# Patient Record
Sex: Female | Born: 1990 | Race: White | Hispanic: No | Marital: Single | Smoking: Former smoker
Health system: Southern US, Community
[De-identification: ages and names within clinical notes are randomized; demographics above are authoritative.]

## PROBLEM LIST (undated history)

## (undated) DIAGNOSIS — T7840XA Allergy, unspecified, initial encounter: Secondary | ICD-10-CM

## (undated) HISTORY — DX: Allergy, unspecified, initial encounter: T78.40XA

---
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
## 2014-08-07 ENCOUNTER — Emergency Department (HOSPITAL_COMMUNITY)
Admission: EM | Admit: 2014-08-07 | Discharge: 2014-08-07 | Discharge status: Absent without leave | Payer: Self-pay | Source: Home / Self Care

## 2014-08-07 ENCOUNTER — Ambulatory Visit (INDEPENDENT_AMBULATORY_CARE_PROVIDER_SITE_OTHER): Payer: Commercial Managed Care - PPO

## 2014-08-07 ENCOUNTER — Ambulatory Visit (INDEPENDENT_AMBULATORY_CARE_PROVIDER_SITE_OTHER): Payer: Commercial Managed Care - PPO | Admitting: Family Medicine

## 2014-08-07 DIAGNOSIS — S20212A Contusion of left front wall of thorax, initial encounter: Secondary | ICD-10-CM

## 2014-08-07 DIAGNOSIS — R21 Rash and other nonspecific skin eruption: Secondary | ICD-10-CM

## 2014-08-07 DIAGNOSIS — M25512 Pain in left shoulder: Secondary | ICD-10-CM

## 2014-08-07 DIAGNOSIS — S20219A Contusion of unspecified front wall of thorax, initial encounter: Secondary | ICD-10-CM

## 2014-08-07 DIAGNOSIS — M25519 Pain in unspecified shoulder: Secondary | ICD-10-CM

## 2014-08-07 MED ORDER — CYCLOBENZAPRINE HCL 10 MG PO TABS: 10.0000 mg | ORAL_TABLET | Freq: Two times a day (BID) | ORAL | Status: DC | PRN

## 2014-08-07 MED ORDER — NYSTATIN-TRIAMCINOLONE 100000-0.1 UNIT/GM-% EX OINT: 1.0000 "application " | TOPICAL_OINTMENT | Freq: Two times a day (BID) | CUTANEOUS | Status: DC
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
## 2014-08-20 ENCOUNTER — Telehealth: Payer: Self-pay

## 2014-08-20 NOTE — Telephone Encounter (Signed)
Unable to lm- VM is full. Pt would need to be seen for a referral.
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
## 2014-11-18 ENCOUNTER — Ambulatory Visit (INDEPENDENT_AMBULATORY_CARE_PROVIDER_SITE_OTHER): Payer: Commercial Managed Care - PPO | Admitting: Internal Medicine

## 2014-11-18 VITALS — BP 116/84 | HR 53 | Temp 98.6°F | Resp 18 | Ht 69.25 in | Wt 180.2 lb

## 2014-11-18 DIAGNOSIS — R300 Dysuria: Secondary | ICD-10-CM

## 2014-11-18 DIAGNOSIS — Z7251 High risk heterosexual behavior: Secondary | ICD-10-CM

## 2014-11-18 DIAGNOSIS — R5382 Chronic fatigue, unspecified: Secondary | ICD-10-CM

## 2014-11-18 DIAGNOSIS — Z0000 Encounter for general adult medical examination without abnormal findings: Secondary | ICD-10-CM

## 2014-11-18 DIAGNOSIS — Z124 Encounter for screening for malignant neoplasm of cervix: Secondary | ICD-10-CM

## 2014-11-18 LAB — POCT CBC
Granulocyte percent: 64.5 %G (ref 37–80)
HEMATOCRIT: 43.6 % (ref 37.7–47.9)
HEMOGLOBIN: 14.1 g/dL (ref 12.2–16.2)
LYMPH, POC: 2.1 (ref 0.6–3.4)
MCH, POC: 29 pg (ref 27–31.2)
MCHC: 32.3 g/dL (ref 31.8–35.4)
MCV: 89.7 fL (ref 80–97)
MID (CBC): 0.5 (ref 0–0.9)
MPV: 9 fL (ref 0–99.8)
POC GRANULOCYTE: 4.6 (ref 2–6.9)
POC LYMPH PERCENT: 28.8 %L (ref 10–50)
POC MID %: 6.7 % (ref 0–12)
Platelet Count, POC: 263 10*3/uL (ref 142–424)
RBC: 4.86 M/uL (ref 4.04–5.48)
RDW, POC: 12.5 %
WBC: 7.2 10*3/uL (ref 4.6–10.2)

## 2014-11-18 LAB — POCT UA - MICROSCOPIC ONLY
Casts, Ur, LPF, POC: NEGATIVE
Crystals, Ur, HPF, POC: NEGATIVE
MUCUS UA: NEGATIVE
Yeast, UA: NEGATIVE

## 2014-11-18 LAB — POCT URINALYSIS DIPSTICK
Bilirubin, UA: NEGATIVE
GLUCOSE UA: NEGATIVE
Ketones, UA: NEGATIVE
NITRITE UA: NEGATIVE
PROTEIN UA: NEGATIVE
Spec Grav, UA: 1.02
Urobilinogen, UA: 0.2
pH, UA: 6.5

## 2014-11-18 LAB — COMPREHENSIVE METABOLIC PANEL
ALBUMIN: 4.4 g/dL (ref 3.5–5.2)
ALT: 21 U/L (ref 0–35)
AST: 18 U/L (ref 0–37)
Alkaline Phosphatase: 63 U/L (ref 39–117)
BUN: 13 mg/dL (ref 6–23)
CALCIUM: 9.8 mg/dL (ref 8.4–10.5)
CO2: 23 meq/L (ref 19–32)
Chloride: 103 mEq/L (ref 96–112)
Creat: 0.79 mg/dL (ref 0.50–1.10)
Glucose, Bld: 89 mg/dL (ref 70–99)
Potassium: 4.3 mEq/L (ref 3.5–5.3)
Sodium: 136 mEq/L (ref 135–145)
Total Bilirubin: 0.4 mg/dL (ref 0.2–1.2)
Total Protein: 7.3 g/dL (ref 6.0–8.3)

## 2014-11-18 MED ORDER — ETONOGESTREL-ETHINYL ESTRADIOL 0.12-0.015 MG/24HR VA RING: VAGINAL_RING | VAGINAL | Status: AC

## 2014-11-18 MED ORDER — AMOXICILLIN 500 MG PO CAPS
500.0000 mg | ORAL_CAPSULE | Freq: Two times a day (BID) | ORAL | Status: AC
Start: 2014-11-18 — End: 2014-11-25

## 2014-11-18 MED ORDER — FLUCONAZOLE 150 MG PO TABS: 150.0000 mg | ORAL_TABLET | Freq: Once | ORAL | Status: AC

## 2014-11-18 NOTE — Progress Notes (Signed)
Subjective:    Patient ID: [name], female    DOB: August 04, 1991, 23 y.o.   MRN: [id number]  HPI Brenda Fisher is a 23yo female with a h/o allergic rhinitis presenting with a month of productive cough, nasal congestion and sinus HAs, a week of dysuria and frequency, 2 weeks of mouth pain, several mos of fatigue and desire for well woman exam.    Cough, HAs and nasal congestion: Has had these sx for the past month.  The cough is not improving and is productive of yellow and green sputum.  She states that it initially improved for a few days and then worsened again.  She endorses intermittent fevers over the past week or so with T of 100.1 this weekend.  Her headaches have been associated with sinus pressure and located over her frontal and maxillary sinuses.  She has been taking tylenol cold and flu with little relief.  She has a h/o seasonal allergies and takes flonase and zyrtec prn for these sx (worst in the spring). She denies n/v, rash, and sore throat.  Dysuria: Has a h/o recurrent UTIs and has had about a week of dysuria, and frequency.  She denies back pain and vaginal itching or burning.  She endorses a h/o 4-5 UTIs a year for the past 3-4 yrs, denies previous workup for stricture or abnormality of the urinary tract.  She urinates after intercourse and drinks cranbery juice when she starts to have sx but still gets UTIs.  She denies a h/o pyelo.  Dental pain: She had 2 cavities on her upper back right teeth, and for the past few weeks has had pain between her teeth and flossing produces foul smelling food particles.  She endorses tenderness along her mandible on that side and has not been able to chew with her teeth on the right for the past 1-2 weeks.  The pain is intermittent and "flares up" throughout the day  Fatigue: She endorses daily fatigue for the past few mos as well as wt gain despite being cautious about what she is eating.  She has also been more irritated lately and has had  more mood swings.  She goes from feeling down and depressed to being irritable more easily than in the past.  She had a TSH wnl a yr ago and has a sister with hypothyroidism.  No h/o anemia as far as she knows.  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    Well woman exam: She works as a Engineer, civil (consulting)nurse at [employer], and has been in her position on ICU step down since June.  She enjoys her job, but works mostly nights (3 12hr shifts a week), which makes sleeping and eating patterns difficult.  She lives with her parents.  She denies medical conditions, medications and allergies.  She is UTD on her Tdap and flu vaccines.  She has not had gardisil.  She is sexually active with one female partner whom she has been with for one year.  She has 2 lifetime sexual partners.  She does not always use condoms and has not been tested for STDs before.  She has never had a pap smear.  She is on nuvaring for birth control.     Past Medical History  Diagnosis Date  . Allergy    No Known Allergies  No current outpatient prescriptions on file prior to visit.   History reviewed. No pertinent family history.  Social  Hx: Social History  . Marital Status: Single   Occupational History  . Nurse at [employer]   Social History Main Topics  . Smoking status: Former Games developermoker  . Smokeless tobacco: None  . Alcohol Use: 2.0 oz/week    4 drink(s) per week  . Drug Use: No    Review of Systems  Constitutional: Positive for fever and fatigue. Negative for appetite change.  HENT: Positive for congestion, dental problem, ear pain and sinus pressure. Negative for hearing loss, rhinorrhea and sore throat.   Eyes: Negative for discharge and redness.  Respiratory: Positive for cough. Negative for shortness of breath.   Cardiovascular: Negative for chest pain.  Gastrointestinal: Negative for nausea, vomiting, abdominal pain and diarrhea.  Genitourinary: Positive for dysuria and  frequency. Negative for urgency, hematuria, flank pain, vaginal discharge, menstrual problem and dyspareunia.  Skin: Negative for rash.  Allergic/Immunologic: Positive for environmental allergies.  Neurological: Positive for headaches. Negative for dizziness.  Psychiatric/Behavioral: Positive for sleep disturbance and dysphoric mood. Negative for confusion.       Objective:   Filed Vitals:   11/18/14 1015  BP: 116/84  Pulse: 53  Temp: 98.6 F (37 C)  Resp: 18    Physical Exam  Constitutional: She is oriented to person, place, and time. She appears well-developed and well-nourished. No distress.  HENT:  Head: Normocephalic and atraumatic.  Right Ear: Tympanic membrane, external ear and ear canal normal.  Left Ear: Tympanic membrane, external ear and ear canal normal.  Nose: Nose normal.  Mouth/Throat: Uvula is midline, oropharynx is clear and moist and mucous membranes are normal. No oral lesions. No dental abscesses. No oropharyngeal exudate.  Mild cobblestoning of posterior pharynx. No tenderness to palpation of teeth  Eyes: Conjunctivae and EOM are normal. Pupils are equal, round, and reactive to light. No scleral icterus.  Neck: Normal range of motion. Neck supple. No thyromegaly present.  Cardiovascular: Normal rate and regular rhythm.   No murmur heard. Pulmonary/Chest: Effort normal and breath sounds normal. No respiratory distress. She has no wheezes. She has no rales.  Abdominal: Soft. Bowel sounds are normal. She exhibits no distension. There is no tenderness. There is no rebound and no guarding.  Genitourinary: There is no lesion on the right labia. There is no lesion on the left labia. No erythema in the vagina. Vaginal discharge found.  Musculoskeletal: Normal range of motion.  Lymphadenopathy:    She has no cervical adenopathy.  Neurological: She is alert and oriented to person, place, and time. No cranial nerve deficit. She exhibits normal muscle tone.  Skin: Skin  is warm and dry. No rash noted.  Psychiatric: She has a normal mood and affect. Her behavior is normal. Thought content normal.       Assessment & Plan:   1. Sinusitis: -Has had nasal congestion, productive cough and sinus HAs for the past month and has maxillary sinus tenderness on exam -Given she has had sx for 30 days, sx improved and then worsened again and she has had fevers, will treat as bacterial -Rx amoxicillin 500mg  BID for 7 days and diflucan x1 as pt endorses frequent yeast infxns with abx -Encouraged use of pseudophed, afrin and nasal saline for sinus pressure and congestion as well -Discussed likely contribution of allergic rhinitis.  Cont prn zyrtec and flonase  2. Dysuria -UA with trace LE and bacteria and moderate blood -Obtain urine culture and can take azo for dysuria -Discussed other causes of dysuria  3. Fatigue -Several mos  of fatigue.  Likely due to disruption of sleep cycle with working the night shift as a Engineer, civil (consulting) -Obtain TSH, CBC and  CMP to r/o other causes  4. Dental pain -Several weeks of right sided dental pain after having 2 cavities filled -No dental abscess seen on exam and to ttp of teeth or buccal mucosa  -If no improvement over the next 1-2 weeks, pt plans to make an appt with her dentist to have this evaluated  5. Preventative Care -Doing well overall.  Living with her parents, working as a Engineer, civil (consulting) and in a monogamous relationship for the past year -Obtain HIV, gonorrhea and chlamydia as pt has not had prior STI testing -Obtained pap smear today -UTD on vaccinations and declines gardasil today  6. Birth control -On nuvaring, plan to refill for a year  Dr Brandon Melnick assisted with this evaluation.//RPD  Addendum labs Results for orders placed or performed in visit on 11/18/14  Comprehensive metabolic panel  Result Value Ref Range   Sodium 136 135 - 145 mEq/L   Potassium 4.3 3.5 - 5.3 mEq/L   Chloride 103 96 - 112 mEq/L   CO2 23 19 - 32 mEq/L     Glucose, Bld 89 70 - 99 mg/dL   BUN 13 6 - 23 mg/dL   Creat 5.36 6.44 - 0.34 mg/dL   Total Bilirubin 0.4 0.2 - 1.2 mg/dL   Alkaline Phosphatase 63 39 - 117 U/L   AST 18 0 - 37 U/L   ALT 21 0 - 35 U/L   Total Protein 7.3 6.0 - 8.3 g/dL   Albumin 4.4 3.5 - 5.2 g/dL   Calcium 9.8 8.4 - 74.2 mg/dL  TSH  Result Value Ref Range   TSH 1.546 0.350 - 4.500 uIU/mL  HIV antibody  Result Value Ref Range   HIV 1&2 Ab, 4th Generation NONREACTIVE NONREACTIVE  POCT CBC  Result Value Ref Range   WBC 7.2 4.6 - 10.2 K/uL   Lymph, poc 2.1 0.6 - 3.4   POC LYMPH PERCENT 28.8 10 - 50 %L   MID (cbc) 0.5 0 - 0.9   POC MID % 6.7 0 - 12 %M   POC Granulocyte 4.6 2 - 6.9   Granulocyte percent 64.5 37 - 80 %G   RBC 4.86 4.04 - 5.48 M/uL   Hemoglobin 14.1 12.2 - 16.2 g/dL   HCT, POC 59.5 63.8 - 47.9 %   MCV 89.7 80 - 97 fL   MCH, POC 29.0 27 - 31.2 pg   MCHC 32.3 31.8 - 35.4 g/dL   RDW, POC 75.6 %   Platelet Count, POC 263 142 - 424 K/uL   MPV 9.0 0 - 99.8 fL  POCT UA - Microscopic Only  Result Value Ref Range   WBC, Ur, HPF, POC 4-6    RBC, urine, microscopic 1-4    Bacteria, U Microscopic trace    Mucus, UA neg    Epithelial cells, urine per micros 4-8    Crystals, Ur, HPF, POC neg    Casts, Ur, LPF, POC neg    Yeast, UA neg   POCT urinalysis dipstick  Result Value Ref Range   Color, UA yellow    Clarity, UA clear    Glucose, UA neg    Bilirubin, UA neg    Ketones, UA neg    Spec Grav, UA 1.020    Blood, UA moderate    pH, UA 6.5    Protein, UA neg    Urobilinogen,  UA 0.2    Nitrite, UA neg    Leukocytes, UA Trace    Pap pending//urine culture pending

## 2014-11-19 LAB — PAP IG, CT-NG, RFX HPV ASCU
CHLAMYDIA PROBE AMP: NEGATIVE
GC Probe Amp: NEGATIVE

## 2014-11-19 LAB — TSH: TSH: 1.546 u[IU]/mL (ref 0.350–4.500)

## 2014-11-19 LAB — HIV ANTIBODY (ROUTINE TESTING W REFLEX): HIV 1&2 Ab, 4th Generation: NONREACTIVE

## 2014-11-28 ENCOUNTER — Encounter: Payer: Self-pay | Admitting: *Deleted

## 2014-12-30 ENCOUNTER — Other Ambulatory Visit: Payer: Self-pay | Admitting: Internal Medicine

## 2014-12-30 ENCOUNTER — Telehealth: Payer: Commercial Managed Care - PPO | Admitting: Family

## 2014-12-30 DIAGNOSIS — R059 Cough, unspecified: Secondary | ICD-10-CM

## 2014-12-30 DIAGNOSIS — R05 Cough: Secondary | ICD-10-CM

## 2014-12-30 DIAGNOSIS — N390 Urinary tract infection, site not specified: Secondary | ICD-10-CM

## 2014-12-30 DIAGNOSIS — J069 Acute upper respiratory infection, unspecified: Secondary | ICD-10-CM

## 2014-12-30 MED ORDER — NITROFURANTOIN MONOHYD MACRO 100 MG PO CAPS: 100.0000 mg | ORAL_CAPSULE | Freq: Two times a day (BID) | ORAL | Status: DC

## 2014-12-30 MED ORDER — AZITHROMYCIN 250 MG PO TABS: ORAL_TABLET | ORAL | Status: AC

## 2014-12-30 MED ORDER — BENZONATATE 100 MG PO CAPS: 100.0000 mg | ORAL_CAPSULE | Freq: Three times a day (TID) | ORAL | Status: AC | PRN

## 2014-12-30 NOTE — Progress Notes (Signed)
We are sorry that you are not feeling well.  Here is how we plan to help!  Problem #1:  Based on what you have shared with me it looks like you have upper respiratory tract inflammation that has resulted in a signification cough.  Inflammation and infection in the upper respiratory tract is commonly called bronchitis and has four common causes:  Allergies, Viral Infections, Acid Reflux and Bacterial Infections.  Allergies, viruses and acid reflux are treated by controlling symptoms or eliminating the cause. An example might be a cough caused by taking certain blood pressure medications. You stop the cough by changing the medication. Another example might be a cough caused by acid reflux. Controlling the reflux helps control the cough.  Based on your presentation I believe you most likely have A cough due to bacteria.  When patients have a fever and a productive cough with a change in color or increased sputum production, we are concerned about bacterial bronchitis.  If left untreated it can progress to pneumonia.  If your symptoms do not improve with your treatment plan it is important that you contact your provider.   I have prescribed Azithromyin 250 mg: two tablets now and then one tablet daily for 4 additonal days   In addition you may use A non-prescription cough medication called Robitussin DAC. Take 2 teaspoons every 8 hours or Delsym: take 2 teaspoons every 12 hours., A non-prescription cough medication called Mucinex DM: take 2 tablets every 12 hours. and A prescription cough medication called Tessalon Perles 100mg . You may take 1-2 capsules every 8 hours as needed for your cough.    HOME CARE . Only take medications as instructed by your medical team. . Complete the entire course of an antibiotic. . Drink plenty of fluids and get plenty of rest. . Avoid close contacts especially the very young and the elderly . Cover your mouth if you cough or cough into your sleeve. . Always remember to  wash your hands . A steam or ultrasonic humidifier can help congestion.    GET HELP RIGHT AWAY IF: . You develop worsening fever. . You become short of breath . You cough up blood. . Your symptoms persist after you have completed your treatment plan MAKE SURE YOU   Understand these instructions.  Will watch your condition.  Will get help right away if you are not doing well or get worse.   Problem #2:  Based on what you shared with me it looks like you most likely have a simple urinary tract infection.  A UTI (Urinary Tract Infection) is a bacterial infection of the bladder.  Most cases of urinary tract infections are simple to treat but a key part of your care is to encourage you to drink plenty of fluids and watch your symptoms carefully.  I have prescribed MacroBid 100 mg twice a day for 5 days.  Your symptoms should gradually improve. Call us if the burning in your urine worsens, you develop worsening fever, back pain or pelvic pain or if your symptoms do not resolve after completing the antibiotic.  Urinary tract infections can be prevented by drinking plenty of water to keep your body hydrated.  Also be sure when you wipe, wipe from front to back and don't hold it in!  If possible, empty your bladder every 4 hours.  Your e-visit answers were reviewed by a board certified advanced clinical practitioner to complete your personal care plan.  Depending on the condition, your plan could  have included both over the counter or prescription medications.  If there is a problem please reply  once you have received a response from your provider.  Your safety is important to [place].  If you have drug allergies check your prescription carefully.    You can use MyChart to ask questions about today's visit, request a non-urgent call back, or ask for a work or school excuse.  You will get an e-mail in the next two days asking about your experience.  I hope that your e-visit has been valuable  and will speed your recovery. Thank you for using e-visits.  ==========================================

## 2015-01-01 ENCOUNTER — Other Ambulatory Visit: Payer: Self-pay | Admitting: Internal Medicine

## 2015-02-11 ENCOUNTER — Encounter: Payer: Self-pay | Admitting: Family Medicine

## 2015-02-11 DIAGNOSIS — Z3009 Encounter for other general counseling and advice on contraception: Secondary | ICD-10-CM

## 2015-02-12 NOTE — Telephone Encounter (Signed)
Called her- it looks like Dr. Theotis Barriooolitle gave her a year's work of nuva ring in December.  She did not seem aware of this.  She is a [employer] employee and advised her that she will likely get the best price at the [facility].  She will call them

## 2015-05-05 ENCOUNTER — Telehealth: Payer: Commercial Managed Care - PPO | Admitting: Family

## 2015-05-05 DIAGNOSIS — R059 Cough, unspecified: Secondary | ICD-10-CM

## 2015-05-05 DIAGNOSIS — R05 Cough: Secondary | ICD-10-CM

## 2015-05-05 NOTE — Progress Notes (Signed)
Pt requesting preventative medication. Pt told to follow up with PCP

## 2015-05-17 IMAGING — CR DG SHOULDER 2+V*L*
3 series · 3 of 3 positions shown · non-contrast
Comparison: None.

CLINICAL DATA: Left shoulder pain.

EXAM:
LEFT SHOULDER - 2+ VIEW

[pa y view]
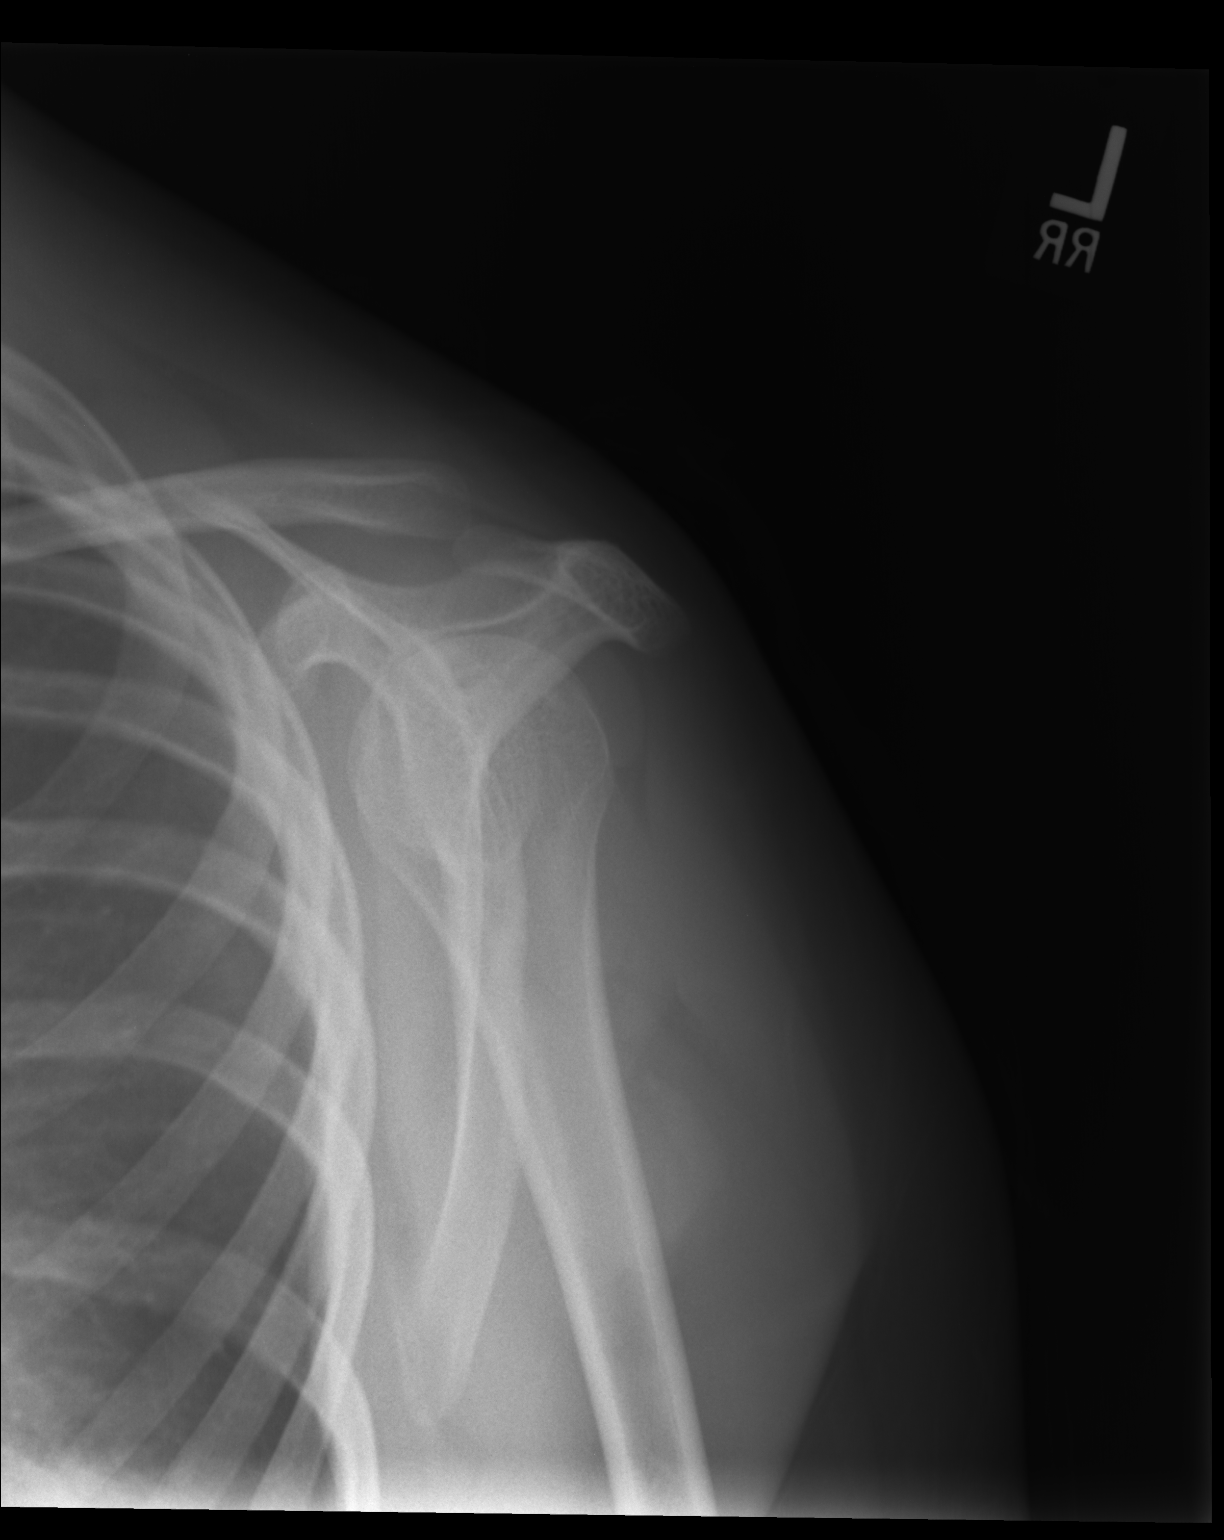

[axial]
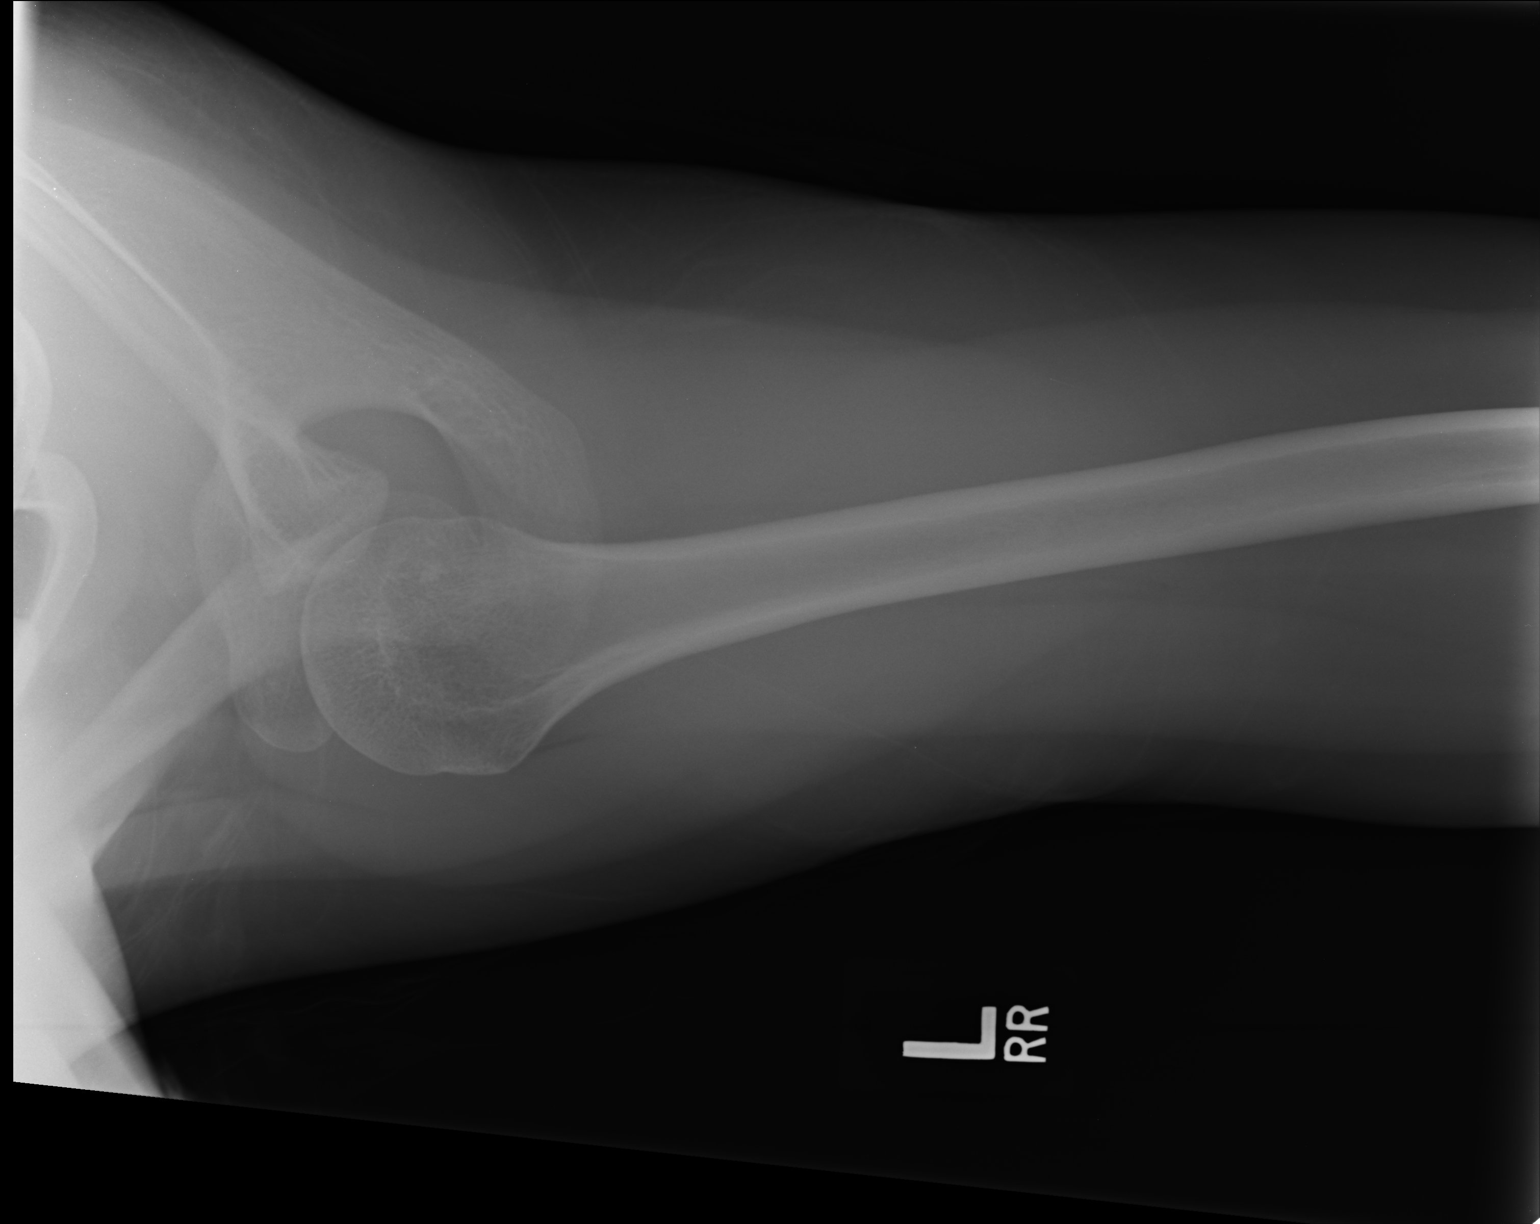

[AP]
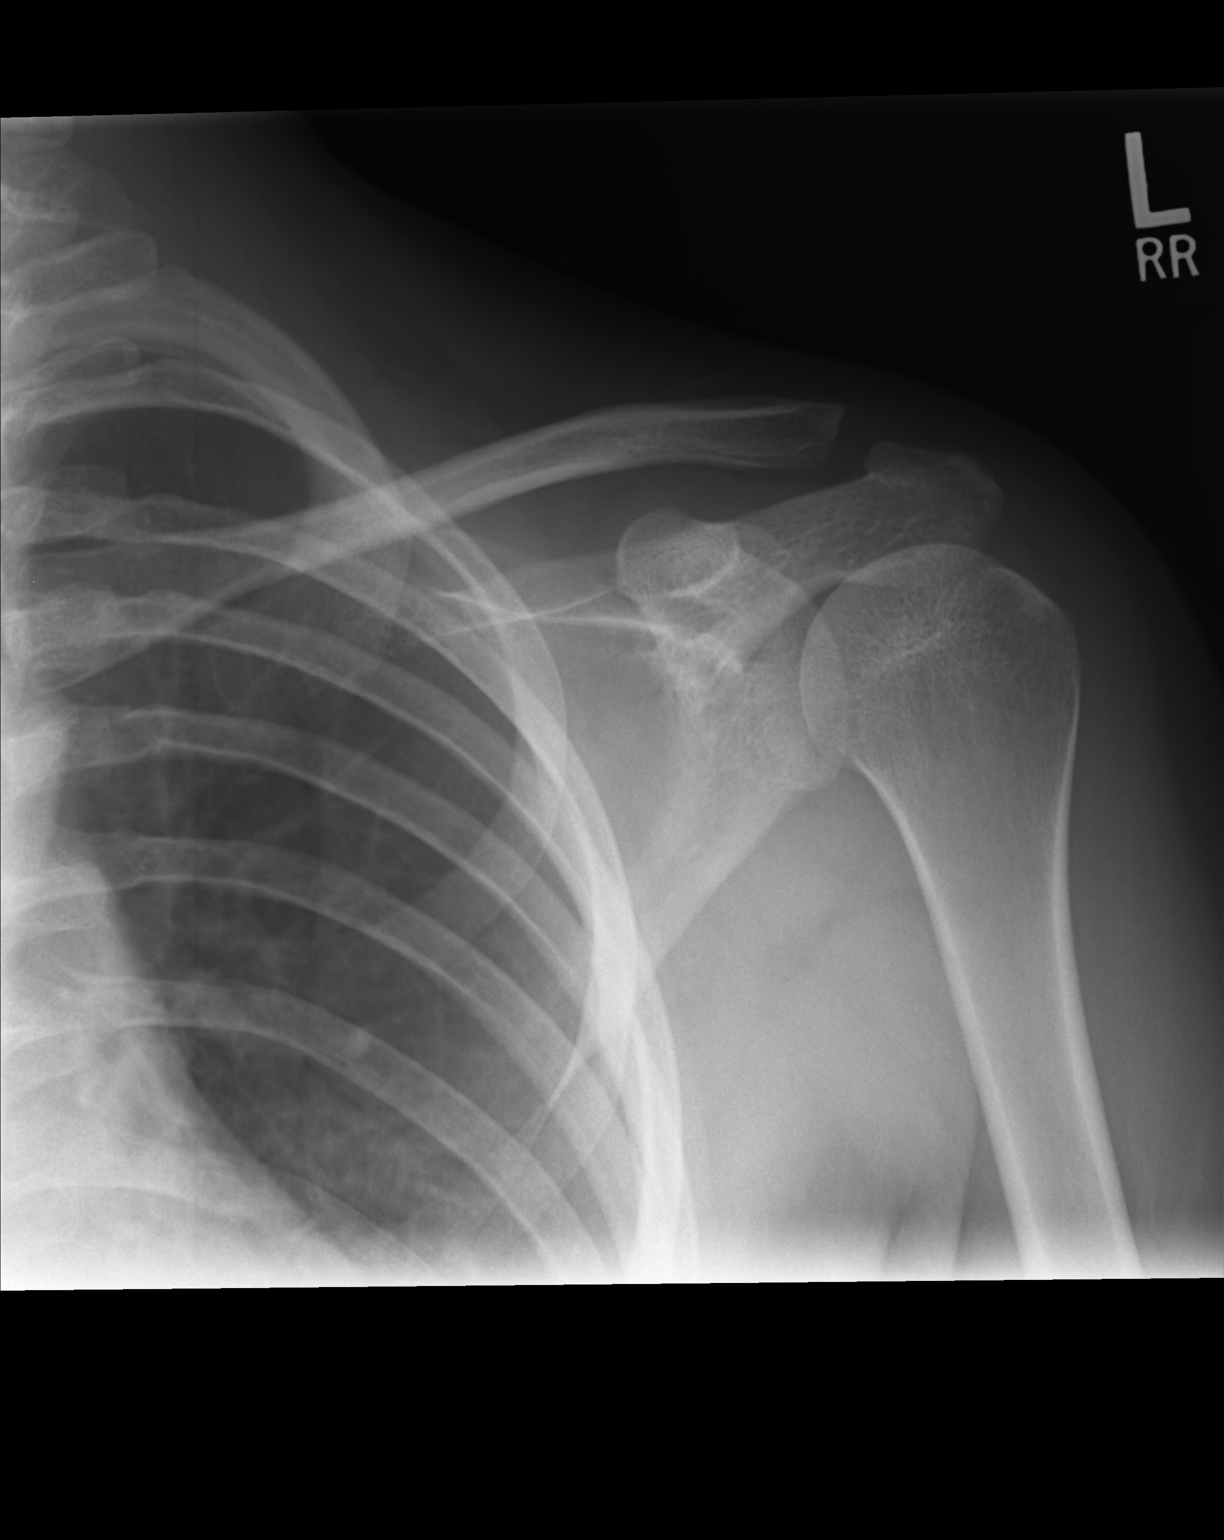

[3 of 3 positions shown; findings below may reference images not displayed]

FINDINGS: There is no evidence of fracture or dislocation. There is no
evidence of arthropathy or other focal bone abnormality. Soft
tissues are unremarkable.
IMPRESSION: Normal left shoulder.
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
## 2015-06-24 ENCOUNTER — Encounter: Payer: Self-pay | Admitting: Family Medicine

## 2015-06-25 ENCOUNTER — Telehealth: Payer: Self-pay

## 2015-06-25 NOTE — Telephone Encounter (Signed)
Patient left voicemail this morning at 8:25am requesting a copy of her recent physical emailed to her [facility] email. I left her a message at 9:22am stating we can't email records but she can sign a ROI form and we can fax, mail or she can pick up records. Will wait for a call back. Patient just got off work and is sleeping per her voicemail she left us.

## 2015-06-27 NOTE — Telephone Encounter (Signed)
ROI form received. Called patient because her visit from December 2015 also included personal info such as STD testing, dysuria, dental issues, etc. Per patient, its ok to send office notes only to [facility]. Do not send any labs. Will fax thru Epic.

## 2015-08-05 ENCOUNTER — Telehealth: Payer: Self-pay

## 2015-08-05 NOTE — Telephone Encounter (Signed)
Spoke with patient. 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 Her last CPE was 11/18/14. I asked her if the travel agency had a PE form that needs to be completed and she wasn't sure. She will see if they have a form and will drop it off to be completed if they have one.

## 2015-08-05 NOTE — Telephone Encounter (Signed)
Patient left voicemail today at 1026 requesting a call back. No details given. Cb# [phone].

## 2015-10-29 ENCOUNTER — Telehealth: Payer: Self-pay

## 2015-10-29 NOTE — Telephone Encounter (Signed)
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 Pt leaves Friday morning at 8am. Pt needs to have form filled out to show she has had a physical within the past year.

## 2015-11-18 ENCOUNTER — Other Ambulatory Visit: Payer: Self-pay | Admitting: Internal Medicine

## 2017-12-13 ENCOUNTER — Telehealth: Payer: Self-pay | Admitting: Family

## 2017-12-13 DIAGNOSIS — N390 Urinary tract infection, site not specified: Secondary | ICD-10-CM

## 2017-12-13 DIAGNOSIS — A499 Bacterial infection, unspecified: Secondary | ICD-10-CM

## 2017-12-13 MED ORDER — NITROFURANTOIN MONOHYD MACRO 100 MG PO CAPS: 100.0000 mg | ORAL_CAPSULE | Freq: Two times a day (BID) | ORAL | 0 refills | Status: DC

## 2017-12-13 NOTE — Progress Notes (Signed)

## 2017-12-23 ENCOUNTER — Telehealth: Payer: Self-pay | Admitting: Family

## 2017-12-23 DIAGNOSIS — N390 Urinary tract infection, site not specified: Secondary | ICD-10-CM
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
## 2018-11-23 ENCOUNTER — Telehealth: Payer: Self-pay | Admitting: Family Medicine

## 2018-11-23 DIAGNOSIS — N390 Urinary tract infection, site not specified: Secondary | ICD-10-CM

## 2018-11-23 MED ORDER — NITROFURANTOIN MONOHYD MACRO 100 MG PO CAPS: 100.0000 mg | ORAL_CAPSULE | Freq: Two times a day (BID) | ORAL | 0 refills | Status: AC

## 2018-11-23 NOTE — Progress Notes (Signed)

## 2019-03-10 ENCOUNTER — Telehealth: Payer: BLUE CROSS/BLUE SHIELD | Admitting: Physician Assistant

## 2019-03-10 NOTE — Progress Notes (Signed)
Erroneous encounter. Patient is not in  and therefore medication cannot be sent to requested pharmacy in [place].
